# Patient Record
Sex: Male | Born: 1935 | Race: White | Hispanic: No | Marital: Single | Smoking: Former smoker
Health system: Southern US, Community
[De-identification: ages and names within clinical notes are randomized; demographics above are authoritative.]

## PROBLEM LIST (undated history)

## (undated) DIAGNOSIS — I10 Essential (primary) hypertension: Secondary | ICD-10-CM

## (undated) DIAGNOSIS — E7800 Pure hypercholesterolemia, unspecified: Secondary | ICD-10-CM

## (undated) HISTORY — PX: CATARACT EXTRACTION: SUR2

---

## 2017-03-31 ENCOUNTER — Encounter (HOSPITAL_COMMUNITY): Payer: Self-pay

## 2017-03-31 ENCOUNTER — Emergency Department (HOSPITAL_COMMUNITY)
Admission: EM | Admit: 2017-03-31 | Discharge: 2017-03-31 | Disposition: A | Discharge status: Home / Self Care | Payer: Medicare Other | Attending: Physician Assistant | Admitting: Physician Assistant

## 2017-03-31 ENCOUNTER — Emergency Department (HOSPITAL_COMMUNITY): Payer: Medicare Other

## 2017-03-31 DIAGNOSIS — S8992XA Unspecified injury of left lower leg, initial encounter: Secondary | ICD-10-CM | POA: Diagnosis present

## 2017-03-31 DIAGNOSIS — Y999 Unspecified external cause status: Secondary | ICD-10-CM | POA: Insufficient documentation

## 2017-03-31 DIAGNOSIS — Z7982 Long term (current) use of aspirin: Secondary | ICD-10-CM | POA: Diagnosis not present

## 2017-03-31 DIAGNOSIS — S40812A Abrasion of left upper arm, initial encounter: Secondary | ICD-10-CM | POA: Insufficient documentation

## 2017-03-31 DIAGNOSIS — W010XXA Fall on same level from slipping, tripping and stumbling without subsequent striking against object, initial encounter: Secondary | ICD-10-CM | POA: Diagnosis not present

## 2017-03-31 DIAGNOSIS — I10 Essential (primary) hypertension: Secondary | ICD-10-CM | POA: Diagnosis not present

## 2017-03-31 DIAGNOSIS — Y929 Unspecified place or not applicable: Secondary | ICD-10-CM | POA: Diagnosis not present

## 2017-03-31 DIAGNOSIS — Y939 Activity, unspecified: Secondary | ICD-10-CM | POA: Diagnosis not present

## 2017-03-31 DIAGNOSIS — W19XXXA Unspecified fall, initial encounter: Secondary | ICD-10-CM

## 2017-03-31 DIAGNOSIS — S81812A Laceration without foreign body, left lower leg, initial encounter: Secondary | ICD-10-CM | POA: Insufficient documentation

## 2017-03-31 HISTORY — DX: Essential (primary) hypertension: I10

## 2017-03-31 HISTORY — DX: Pure hypercholesterolemia, unspecified: E78.00

## 2017-03-31 LAB — BASIC METABOLIC PANEL
ANION GAP: 9 (ref 5–15)
BUN: 20 mg/dL (ref 6–20)
CO2: 26 mmol/L (ref 22–32)
Calcium: 9.8 mg/dL (ref 8.9–10.3)
Chloride: 107 mmol/L (ref 101–111)
Creatinine, Ser: 1.17 mg/dL (ref 0.61–1.24)
GFR calc Af Amer: >60 mL/min (ref >60)
GFR, EST NON AFRICAN AMERICAN: 57 mL/min — AB (ref >60)
GLUCOSE: 124 mg/dL — AB (ref 65–99)
POTASSIUM: 4.1 mmol/L (ref 3.5–5.1)
SODIUM: 142 mmol/L (ref 135–145)

## 2017-03-31 LAB — CBC WITH DIFFERENTIAL/PLATELET
Basophils Absolute: 0 10*3/uL (ref 0.0–0.1)
Basophils Relative: 0 %
Eosinophils Absolute: 0.1 10*3/uL (ref 0.0–0.7)
Eosinophils Relative: 1 %
HCT: 37.9 % — ABNORMAL LOW (ref 39.0–52.0)
HEMOGLOBIN: 12.5 g/dL — AB (ref 13.0–17.0)
LYMPHS ABS: 1.8 10*3/uL (ref 0.7–4.0)
Lymphocytes Relative: 18 %
MCH: 29.7 pg (ref 26.0–34.0)
MCHC: 33 g/dL (ref 30.0–36.0)
MCV: 90 fL (ref 78.0–100.0)
Monocytes Absolute: 0.7 10*3/uL (ref 0.1–1.0)
Monocytes Relative: 7 %
Neutro Abs: 7.2 10*3/uL (ref 1.7–7.7)
Neutrophils Relative %: 74 %
PLATELETS: 237 10*3/uL (ref 150–400)
RBC: 4.21 MIL/uL — AB (ref 4.22–5.81)
RDW: 14.7 % (ref 11.5–15.5)
WBC: 9.8 10*3/uL (ref 4.0–10.5)

## 2017-03-31 MED ORDER — CEPHALEXIN 500 MG PO CAPS: 500.0000 mg | ORAL_CAPSULE | Freq: Two times a day (BID) | ORAL | 0 refills | Status: AC

## 2017-03-31 MED ORDER — LIDOCAINE-EPINEPHRINE (PF) 2 %-1:200000 IJ SOLN
20.0000 mL | Freq: Once | INTRAMUSCULAR | Status: AC
  Administered 2017-03-31: 20 mL via INTRADERMAL
  Filled 2017-03-31: qty 20

## 2017-03-31 MED ORDER — TETANUS-DIPHTHERIA TOXOIDS TD 5-2 LFU IM INJ: 0.5000 mL | INJECTION | Freq: Once | INTRAMUSCULAR | Status: DC

## 2017-03-31 MED ORDER — TETANUS-DIPHTH-ACELL PERTUSSIS 5-2.5-18.5 LF-MCG/0.5 IM SUSP
0.5000 mL | Freq: Once | INTRAMUSCULAR | Status: AC
  Administered 2017-03-31: 0.5 mL via INTRAMUSCULAR
  Filled 2017-03-31: qty 0.5

## 2017-03-31 NOTE — ED Triage Notes (Signed)
[facility]- pt coming from home, he tripped over a pile of cinder blocks and has large laceration to the left leg. Pt had approximately 400-500 cc of blood loss. He also reports a small amount of left flank pain and some abrasions to the right arm. Vital signs stable with EMS.

## 2017-03-31 NOTE — ED Provider Notes (Signed)
[facility]Y DEPT Provider Note   CSN: [id number] Arrival date & time: 03/31/17  1703     History   Chief Complaint Chief Complaint  Patient presents with  . Leg Injury  . Fall    HPI Jonathan Bernard. is a 82 y.o. male.  The history is provided by the patient.  Leg Pain   This is a new problem. The current episode started less than 1 hour ago. The problem has not changed since onset.The pain is present in the left lower leg. The quality of the pain is described as aching. The pain is at a severity of 5/10. The pain is moderate. Pertinent negatives include no numbness, full range of motion and no tingling. The symptoms are aggravated by contact. He has tried nothing for the symptoms. There has been a history of trauma.    Past Medical History:  Diagnosis Date  . Hypercholesteremia   . Hypertension     There are no active problems to display for this patient.   History reviewed. No pertinent surgical history.     Home Medications    Prior to Admission medications   Medication Sig Start Date End Date Taking? Authorizing Provider  amLODipine (NORVASC) 10 MG tablet Take 10 mg by mouth daily.   Yes Historical Provider, MD  aspirin EC 325 MG tablet Take 325 mg by mouth every morning.   Yes Historical Provider, MD  CALCIUM-VITAMIN D PO Take 1 tablet by mouth daily.   Yes Historical Provider, MD  cilostazol (PLETAL) 100 MG tablet Take 100 mg by mouth 2 (two) times daily.   Yes Historical Provider, MD  hydrochlorothiazide (HYDRODIURIL) 25 MG tablet Take 25 mg by mouth daily.   Yes Historical Provider, MD  lisinopril (PRINIVIL,ZESTRIL) 40 MG tablet Take 40 mg by mouth daily.   Yes Historical Provider, MD  Multiple Vitamins-Minerals (ONE-A-DAY MENS 50+ ADVANTAGE) TABS Take 1 tablet by mouth daily with breakfast.   Yes Historical Provider, MD  naproxen (NAPROSYN) 375 MG tablet Take 375 mg by mouth 2 (two) times daily with a meal.   Yes Historical Provider, MD  rosuvastatin  (CRESTOR) 20 MG tablet Take 20 mg by mouth daily.   Yes Historical Provider, MD  cephALEXin (KEFLEX) 500 MG capsule Take 1 capsule (500 mg total) by mouth 2 (two) times daily. 03/31/17 04/10/17  Forest Becker, MD    Family History History reviewed. No pertinent family history.  Social History Social History  Substance Use Topics  . Smoking status: Never Smoker  . Smokeless tobacco: Never Used  . Alcohol use No     Allergies   Patient has no known allergies.   Review of Systems Review of Systems  Constitutional: Negative for chills and fever.  HENT: Negative for ear pain and sore throat.   Eyes: Negative for pain and visual disturbance.  Respiratory: Negative for cough and shortness of breath.   Cardiovascular: Positive for chest pain (left ribs). Negative for palpitations.  Gastrointestinal: Negative for abdominal pain and vomiting.  Genitourinary: Negative for dysuria and hematuria.  Musculoskeletal: Positive for gait problem. Negative for arthralgias, back pain and neck pain.  Skin: Positive for wound. Negative for color change and rash.  Neurological: Negative for tingling, seizures, syncope and numbness.  All other systems reviewed and are negative.   Physical Exam Updated Vital Signs BP (!) 157/74   Pulse 75   Temp 97.6 F (36.4 C) (Oral)   Resp 17   SpO2 96%   Physical Exam  Constitutional: He is oriented to person, place, and time. He appears well-developed and well-nourished.  HENT:  Head: Normocephalic and atraumatic.  Eyes: Conjunctivae are normal.  Neck: Neck supple.  Cardiovascular: Normal rate and regular rhythm.   No murmur heard. Symmetric DP pulses  Pulmonary/Chest: Effort normal and breath sounds normal. No respiratory distress.  Symmetric b/l breath sounds  Abdominal: Soft. There is no tenderness.  Musculoskeletal: He exhibits no edema or deformity.  TTP about lower left lateral ribs w/o underlying crepitus  Neurological: He is alert and  oriented to person, place, and time.  Full sensation distal to his injury  Skin: Skin is warm and dry.  Scattered superficial abrasions to the left UE and LE; large hemostatic laceration to the anterolateral left shin  Psychiatric: He has a normal mood and affect.  Nursing note and vitals reviewed.    ED Treatments / Results  Labs (all labs ordered are listed, but only abnormal results are displayed) Labs Reviewed  CBC WITH DIFFERENTIAL/PLATELET - Abnormal; Notable for the following:       Result Value   RBC 4.21 (*)    Hemoglobin 12.5 (*)    HCT 37.9 (*)    All other components within normal limits  BASIC METABOLIC PANEL - Abnormal; Notable for the following:    Glucose, Bld 124 (*)    GFR calc non Af Amer 57 (*)    All other components within normal limits    EKG  EKG Interpretation None       Radiology Dg Tibia/fibula Left  Result Date: 03/31/2017 CLINICAL DATA:  Tripped over center blocks and has laceration to the leg. EXAM: LEFT TIBIA AND FIBULA - 2 VIEW COMPARISON:  None. FINDINGS: No acute fracture or dislocation of the left tibia nor fibula is identified. Popliteal and tibial arteriosclerosis is noted. No radiopaque foreign body within the soft tissues. Osteoarthritis of the femorotibial compartment with medial joint space narrowing and minimal spurring. The ankle mortise appears intact. Small ossific density adjacent to the medial malleolus may reflect chronic remote injury. Mild diffuse soft tissue swelling is seen of the leg with slight indentation along the lateral aspect of the leg on the AP view at midshaft which may reflect a site of soft tissue injury. IMPRESSION: 1. Soft tissue defect along the lateral aspect of the left leg may reflect the site of soft tissue injury/ laceration. 2. Mild soft tissue swelling of the leg. 3. No acute osseous abnormality. 4. No radiopaque foreign body is seen. Electronically Signed   By: Tollie Eth M.D.   On: 03/31/2017 18:24   Dg  Chest Portable 1 View  Result Date: 03/31/2017 CLINICAL DATA:  [facility]- pt coming from home, he tripped over a pile of cinder blocks and has large laceration to the left leg. Pt had approximately 400-500 cc of blood loss. He also reports a small amount of left flank pain and some abrasions to .*comment was truncated* EXAM: PORTABLE CHEST 1 VIEW COMPARISON:  None. FINDINGS: Normal mediastinum and cardiac silhouette. Normal pulmonary vasculature. No evidence of effusion, infiltrate, or pneumothorax. No acute bony abnormality. IMPRESSION: No acute cardiopulmonary process. Electronically Signed   By: Genevive Bi M.D.   On: 03/31/2017 18:21    Procedures .Marland KitchenLaceration Repair Date/Time: 04/01/2017 1:07 AM Performed by: Forest Becker Authorized by: Bary Castilla LYN   Consent:    Consent obtained:  Verbal   Consent given by:  Patient   Risks discussed:  Infection, need for additional repair, pain, poor  cosmetic result and poor wound healing   Alternatives discussed:  Referral Anesthesia (see MAR for exact dosages):    Anesthesia method:  Local infiltration   Local anesthetic:  Lidocaine 2% WITH epi Laceration details:    Location:  Leg   Leg location:  L lower leg   Length (cm):  15 Repair type:    Repair type:  Simple Pre-procedure details:    Preparation:  Patient was prepped and draped in usual sterile fashion Exploration:    Hemostasis achieved with:  Direct pressure   Wound exploration: entire depth of wound probed and visualized     Wound extent: no underlying fracture noted     Contaminated: no   Treatment:    Area cleansed with:  Saline   Amount of cleaning:  Standard   Irrigation solution:  Sterile water   Irrigation method:  Syringe Skin repair:    Repair method:  Sutures   Suture size:  3-0   Suture material:  Prolene   Suture technique: Simple interrupted and horizontal mattress.   Number of sutures:  13 Approximation:    Approximation:  Close Post-procedure  details:    Dressing: Xeroform, antibiotic ointment, sterile gauze, Coban.   Patient tolerance of procedure:  Tolerated well, no immediate complications   (including critical care time)  Medications Ordered in ED Medications  Tdap (BOOSTRIX) injection 0.5 mL (0.5 mLs Intramuscular Given 03/31/17 1815)  lidocaine-EPINEPHrine (XYLOCAINE W/EPI) 2 %-1:200000 (PF) injection 20 mL (20 mLs Intradermal Given 03/31/17 2056)     Initial Impression / Assessment and Plan / ED Course  I have reviewed the triage vital signs and the nursing notes.  Pertinent labs & imaging results that were available during my care of the patient were reviewed by me and considered in my medical decision making (see chart for details).    Pt presents with a large laceration to the LLE. Was standing on a stump trying to reach something when it toppled over, causing him to fall on a pile of bricks; he denies head/neck trauma or LOC. Family called EMS and they applied a dressing to his laceration to stop the bleeding before transporting him here; they estimate he lost ~500cc of blood before they could stop the bleeding. He only complains of LLE and left rib pain.  VS & exam as above. XR w/o fractures or FB. Labs remarkable for Hgb 12.5 (no comparison available). Laceration repaired at the bedside.  Explained all results to the Pt. Will discharge the Pt home with prescription for Keflex. Recommending follow-up with PCP in 1wk for wound check. ED return precautions provided. Pt acknowledged understanding of, and concurrence with the plan. All questions answered to his satisfaction. In stable condition at the time of discharge.   Final Clinical Impressions(s) / ED Diagnoses   Final diagnoses:  Fall, initial encounter  Laceration of left lower extremity, initial encounter    New Prescriptions Discharge Medication List as of 03/31/2017 11:14 PM    START taking these medications   Details  cephALEXin (KEFLEX) 500 MG  capsule Take 1 capsule (500 mg total) by mouth 2 (two) times daily., Starting Fri 03/31/2017, Until Mon 04/10/2017, Print         Forest Becker, MD 04/01/17 0111    Courteney Randall An, MD 04/02/17 1124

## 2018-05-14 IMAGING — CR DG CHEST 1V PORT
1 series · 1 of 1 positions shown · non-contrast
Comparison: None.

CLINICAL DATA: [REDACTED]- pt coming from home, he tripped over a pile
of cinder blocks and has large laceration to the left leg. Pt had
approximately 400-500 cc of blood loss. He also reports a small
amount of left flank pain and some abrasions to ...*comment was
truncated*

EXAM:
PORTABLE CHEST 1 VIEW

[AP]
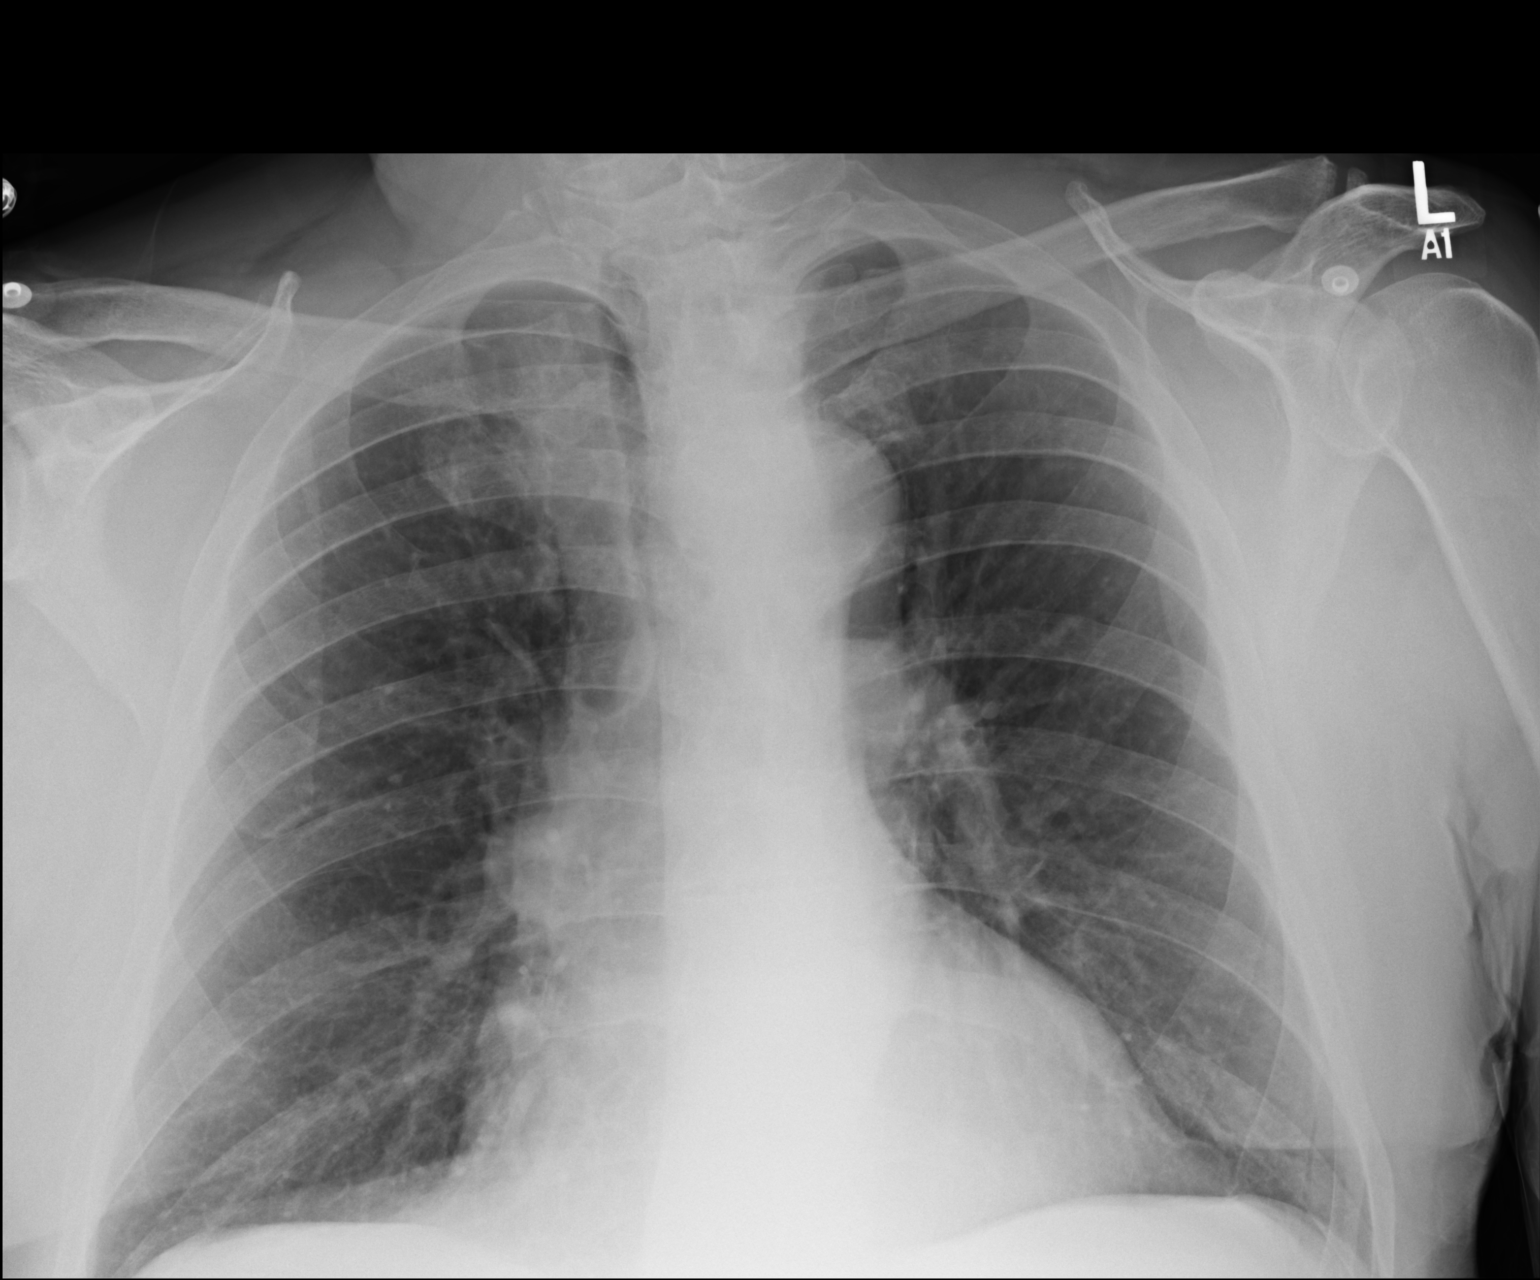

[1 of 1 positions shown; findings below may reference images not displayed]

FINDINGS: Normal mediastinum and cardiac silhouette. Normal pulmonary
vasculature. No evidence of effusion, infiltrate, or pneumothorax.
No acute bony abnormality.
IMPRESSION: No acute cardiopulmonary process.

## 2018-05-14 IMAGING — CR DG TIBIA/FIBULA 2V*L*
4 series · 4 of 4 positions shown · non-contrast
Comparison: None.

CLINICAL DATA: Tripped over center blocks and has laceration to the
leg.

EXAM:
LEFT TIBIA AND FIBULA - 2 VIEW

[AP (1 of 2)]
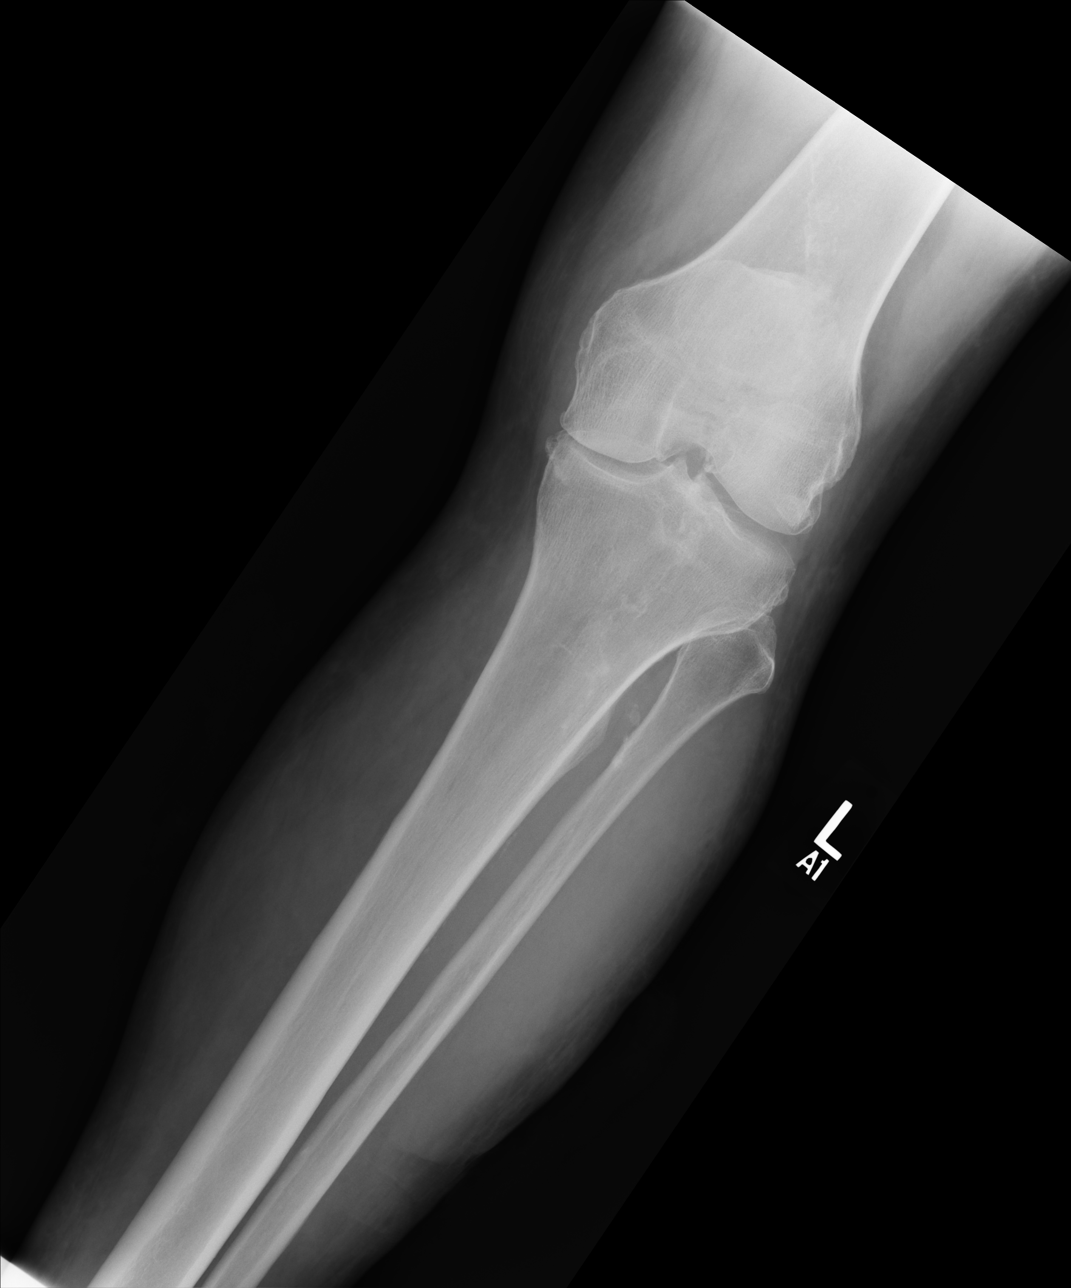

[AP (2 of 2)]
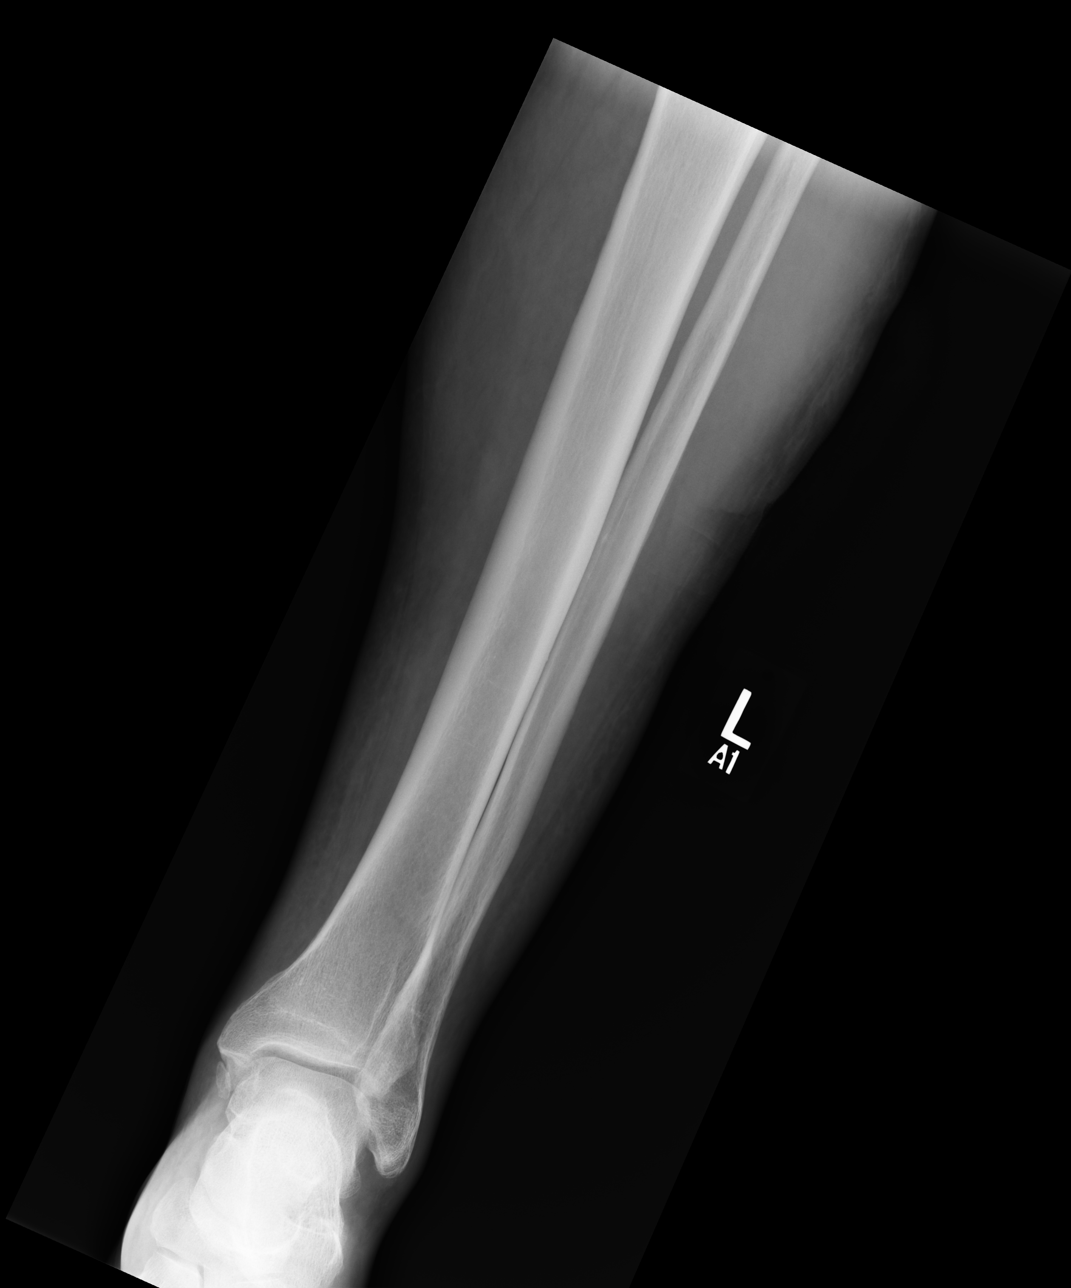

[lateral (1 of 2)]
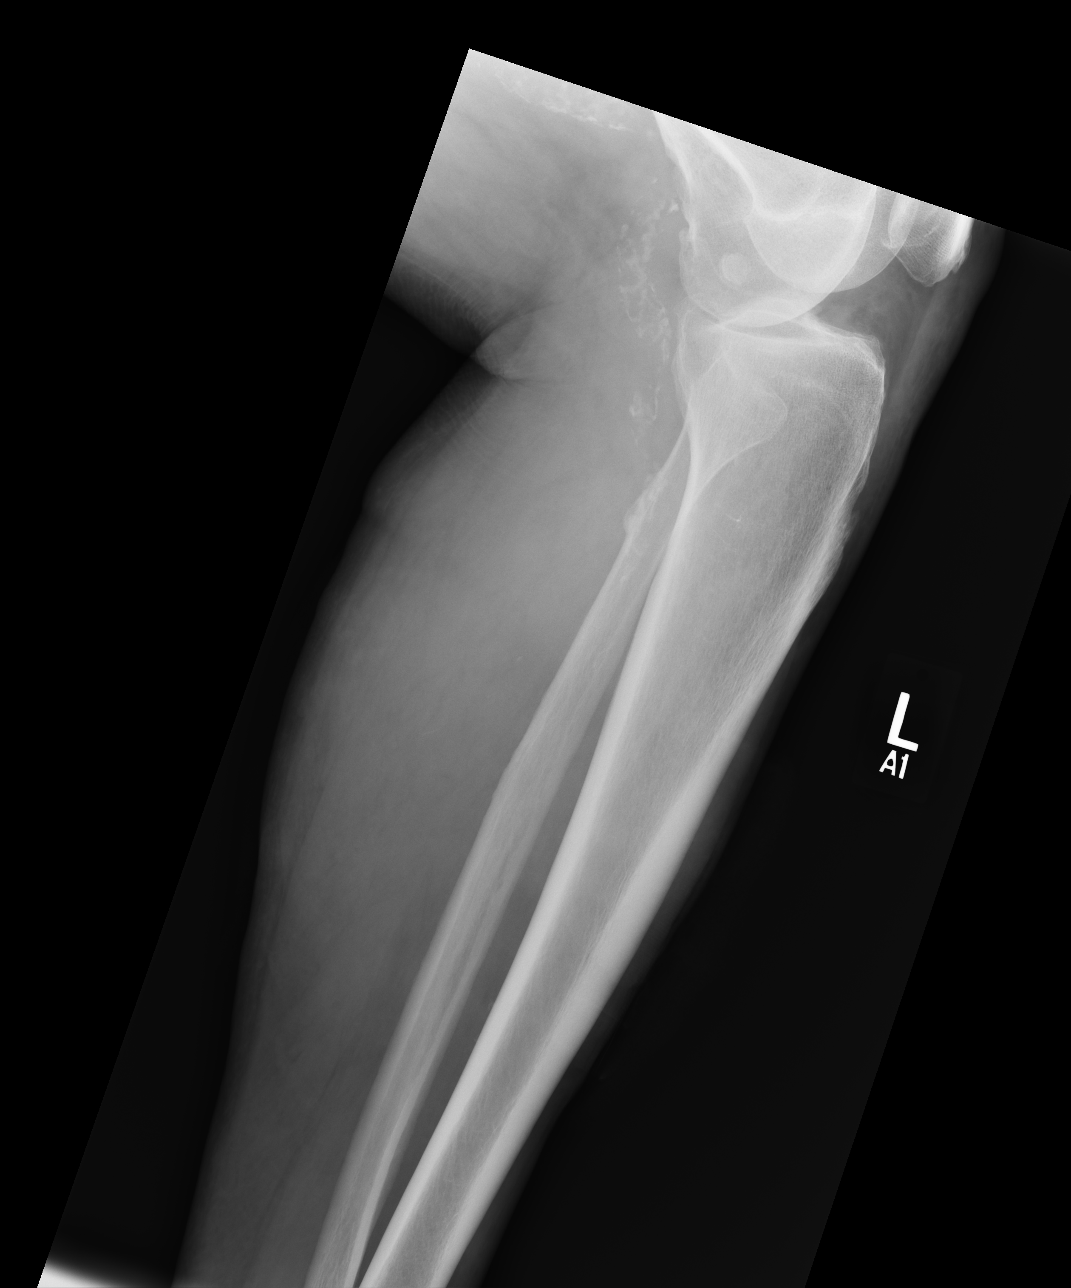

[lateral (2 of 2)]
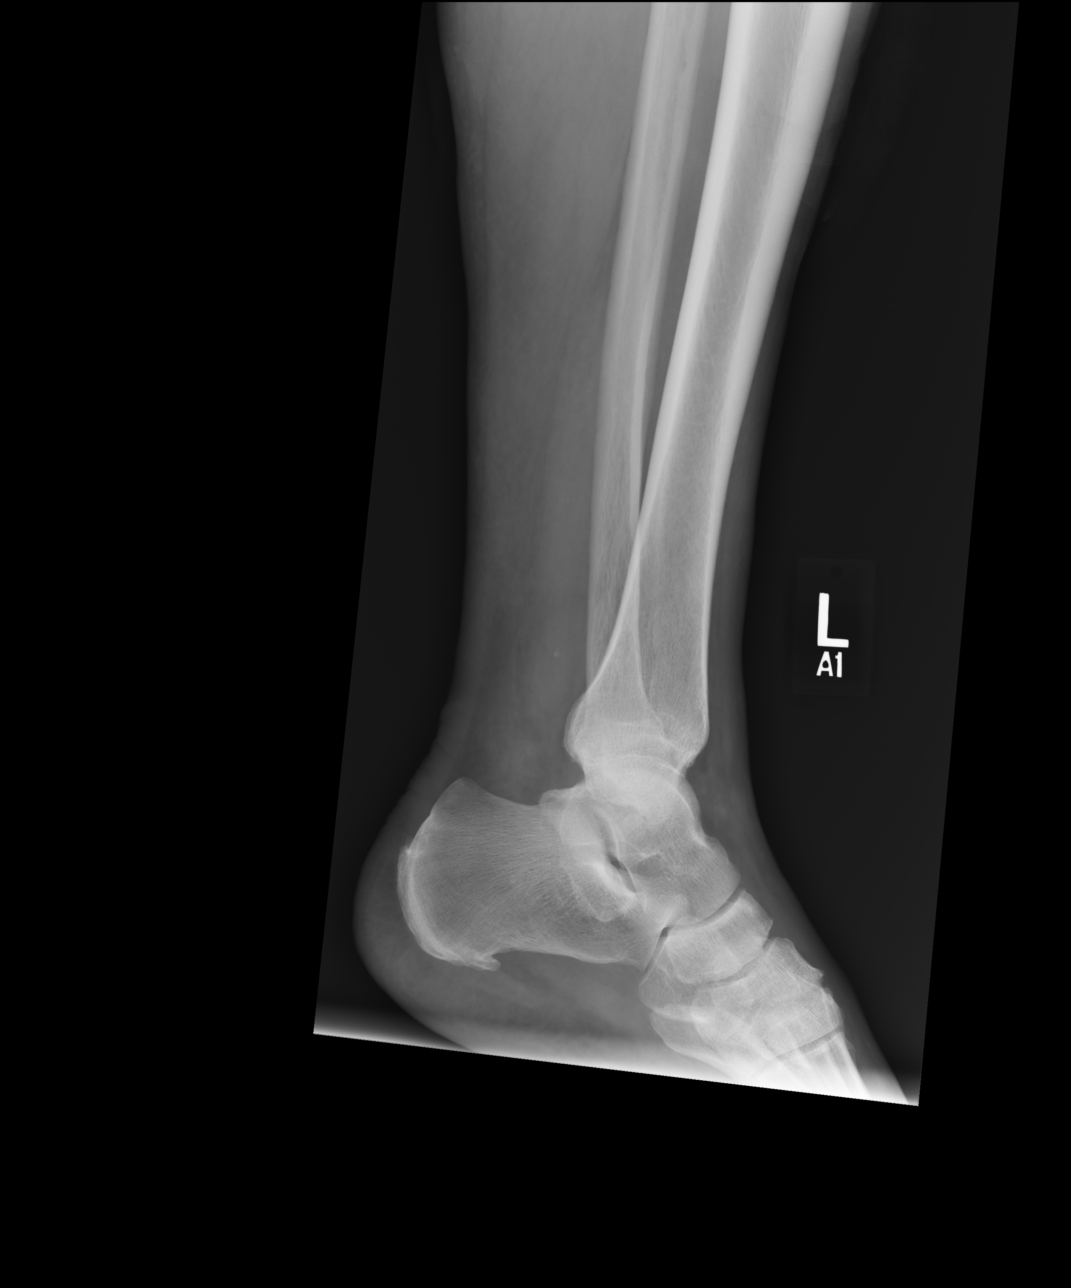

[4 of 4 positions shown; findings below may reference images not displayed]

FINDINGS: No acute fracture or dislocation of the left tibia nor fibula is
identified. Popliteal and tibial arteriosclerosis is noted. No
radiopaque foreign body within the soft tissues. Osteoarthritis of
the femorotibial compartment with medial joint space narrowing and
minimal spurring. The ankle mortise appears intact. Small ossific
density adjacent to the medial malleolus may reflect chronic remote
injury. Mild diffuse soft tissue swelling is seen of the leg with
slight indentation along the lateral aspect of the leg on the AP
view at midshaft which may reflect a site of soft tissue injury.
IMPRESSION: 1. Soft tissue defect along the lateral aspect of the left leg may
reflect the site of soft tissue injury/ laceration.
2. Mild soft tissue swelling of the leg.
3. No acute osseous abnormality.
4. No radiopaque foreign body is seen.

## 2023-06-28 ENCOUNTER — Emergency Department (HOSPITAL_COMMUNITY)
Admission: EM | Admit: 2023-06-28 | Discharge: 2023-06-28 | Disposition: A | Discharge status: Home / Self Care | Payer: No Typology Code available for payment source | Attending: Emergency Medicine | Admitting: Emergency Medicine

## 2023-06-28 ENCOUNTER — Emergency Department (HOSPITAL_BASED_OUTPATIENT_CLINIC_OR_DEPARTMENT_OTHER): Payer: No Typology Code available for payment source

## 2023-06-28 ENCOUNTER — Encounter (HOSPITAL_COMMUNITY): Payer: Self-pay

## 2023-06-28 ENCOUNTER — Emergency Department (HOSPITAL_COMMUNITY): Payer: No Typology Code available for payment source

## 2023-06-28 ENCOUNTER — Other Ambulatory Visit: Payer: Self-pay

## 2023-06-28 DIAGNOSIS — M79604 Pain in right leg: Secondary | ICD-10-CM | POA: Diagnosis not present

## 2023-06-28 DIAGNOSIS — M7989 Other specified soft tissue disorders: Secondary | ICD-10-CM | POA: Diagnosis not present

## 2023-06-28 DIAGNOSIS — M79602 Pain in left arm: Secondary | ICD-10-CM | POA: Diagnosis not present

## 2023-06-28 DIAGNOSIS — Z7982 Long term (current) use of aspirin: Secondary | ICD-10-CM | POA: Insufficient documentation

## 2023-06-28 LAB — BASIC METABOLIC PANEL
Anion gap: 8 (ref 5–15)
BUN: 19 mg/dL (ref 8–23)
CO2: 25 mmol/L (ref 22–32)
Calcium: 8.8 mg/dL — ABNORMAL LOW (ref 8.9–10.3)
Chloride: 104 mmol/L (ref 98–111)
Creatinine, Ser: 0.94 mg/dL (ref 0.61–1.24)
GFR, Estimated: >60 mL/min (ref >60)
Glucose, Bld: 126 mg/dL — ABNORMAL HIGH (ref 70–99)
Potassium: 3.7 mmol/L (ref 3.5–5.1)
Sodium: 137 mmol/L (ref 135–145)

## 2023-06-28 LAB — CBC
HCT: 39.4 % (ref 39.0–52.0)
Hemoglobin: 12.4 g/dL — ABNORMAL LOW (ref 13.0–17.0)
MCH: 28.8 pg (ref 26.0–34.0)
MCHC: 31.5 g/dL (ref 30.0–36.0)
MCV: 91.4 fL (ref 80.0–100.0)
Platelets: 245 10*3/uL (ref 150–400)
RBC: 4.31 MIL/uL (ref 4.22–5.81)
RDW: 17.1 % — ABNORMAL HIGH (ref 11.5–15.5)
WBC: 7.9 10*3/uL (ref 4.0–10.5)
nRBC: 0 % (ref 0.0–0.2)

## 2023-06-28 LAB — TROPONIN I (HIGH SENSITIVITY): Troponin I (High Sensitivity): 8 ng/L (ref <18)

## 2023-06-28 NOTE — Progress Notes (Signed)
   06/28/23 0953  Spiritual Encounters  Type of Visit Initial  Care provided to: Patient  Reason for visit Routine spiritual support  OnCall Visit No  Spiritual Framework  Presenting Themes Meaning/purpose/sources of inspiration;Values and beliefs  Community/Connection Family  Patient Stress Factors Health changes  Family Stress Factors None identified  Interventions  Spiritual Care Interventions Made Established relationship of care and support;Compassionate presence;Reflective listening;Normalization of emotions  Intervention Outcomes  Outcomes Connection to spiritual care;Awareness around self/spiritual resourses;Connection to values and goals of care;Awareness of support  Spiritual Care Plan  Spiritual Care Issues Still Outstanding No further spiritual care needs at this time (see row info)    The patient was admitted to the ED this morning. His doctor suspected a blood clot and wanted to make sure to see. He mentioned that he completed two different majors in [facility] [employer]. He had lived there seven years. He seems healthy looking man. His grand kids lives in [place], [place], but never came to [facility] to visit him. Then, a nurse came and he went to [facility]. He said that he is very appreciated to talk with a chaplain.   M.Kubra Rejina Odle, MA Chaplain Intern [phone]

## 2023-06-28 NOTE — Progress Notes (Signed)
Right leg venous duplex  has been completed. Refer to [facility] under chart review to view preliminary results.   06/28/2023  10:30 AM Avner Stroder, Gerarda Gunther

## 2023-06-28 NOTE — ED Notes (Signed)
Patient transported to [facility] 

## 2023-06-28 NOTE — Discharge Instructions (Signed)
As discussed, your evaluation today has been largely reassuring.  But, it is important that you monitor your condition carefully, and do not hesitate to return to the ED if you develop new, or concerning changes in your condition. ? ?Otherwise, please follow-up with your physician for appropriate ongoing care. ? ?

## 2023-06-28 NOTE — ED Provider Notes (Signed)
[place] EMERGENCY DEPARTMENT AT [facility] Provider Note   CSN: [id number] Arrival date & time: 06/28/23  4098     History  Chief Complaint  Patient presents with   Abnormal Lab    Jonathan Bernard. is a 88 y.o. male.  HPI Patient presents with concern for ongoing swelling in his left arm, right leg and notice of abnormal lab results. He fell about 3 weeks ago, and since that time has had evaluation of his right leg including MRI, x-ray, and he notes that he is generally getting better though he has persistent swelling about the anterior aspect of the right knee. He has been ambulatory since the fall. Patient had an awkward event 2 days ago, with prolonged downtime resting on his left arm and since that time has had pain in the left arm.  Since that time that pain is also improved somewhat, and currently has no pain though there is persistent swelling and ecchymosis on the left arm.  No chest pain, abdominal pain, dyspnea, syncope. He has received care thus far at the [place].  Reportedly the patient had labs sent earlier this week, and today was notified of abnormal results prompting ED evaluation.  Reportedly that result was a positive D-dimer.    Home Medications Prior to Admission medications   Medication Sig Start Date End Date Taking? Authorizing Provider  amLODipine (NORVASC) 10 MG tablet Take 10 mg by mouth daily.    [provider]  aspirin EC 325 MG tablet Take 325 mg by mouth every morning.    [provider]  CALCIUM-VITAMIN D PO Take 1 tablet by mouth daily.    [provider]  cilostazol (PLETAL) 100 MG tablet Take 100 mg by mouth 2 (two) times daily.    [provider]  hydrochlorothiazide (HYDRODIURIL) 25 MG tablet Take 25 mg by mouth daily.    [provider]  lisinopril (PRINIVIL,ZESTRIL) 40 MG tablet Take 40 mg by mouth daily.    [provider]  Multiple Vitamins-Minerals (ONE-A-DAY MENS 50+  ADVANTAGE) TABS Take 1 tablet by mouth daily with breakfast.    [provider]  naproxen (NAPROSYN) 375 MG tablet Take 375 mg by mouth 2 (two) times daily with a meal.    [provider]  rosuvastatin (CRESTOR) 20 MG tablet Take 20 mg by mouth daily.    [provider]      Allergies    Patient has no known allergies.    Review of Systems   Review of Systems  All other systems reviewed and are negative.   Physical Exam Updated Vital Signs BP 138/86   Pulse 87   Temp 98.4 F (36.9 C) (Oral)   Resp (!) 22   Ht 6' (1.829 m)   Wt 95.3 kg   SpO2 98%   BMI 28.48 kg/m  Physical Exam Vitals and nursing note reviewed.  Constitutional:      General: He is not in acute distress.    Appearance: He is well-developed.  HENT:     Head: Normocephalic and atraumatic.  Eyes:     Conjunctiva/sclera: Conjunctivae normal.  Cardiovascular:     Rate and Rhythm: Normal rate and regular rhythm.  Pulmonary:     Effort: Pulmonary effort is normal. No respiratory distress.     Breath sounds: No stridor.  Abdominal:     General: There is no distension.  Musculoskeletal:       Arms:  Legs:  Skin:    General: Skin is warm and dry.  Neurological:     Mental Status: He is alert and oriented to person, place, and time.     ED Results / Procedures / Treatments   Labs (all labs ordered are listed, but only abnormal results are displayed) Labs Reviewed  BASIC METABOLIC PANEL - Abnormal; Notable for the following components:      Result Value   Glucose, Bld 126 (*)    Calcium 8.8 (*)    All other components within normal limits  CBC - Abnormal; Notable for the following components:   Hemoglobin 12.4 (*)    RDW 17.1 (*)    All other components within normal limits  TROPONIN I (HIGH SENSITIVITY)  TROPONIN I (HIGH SENSITIVITY)    EKG EKG Interpretation Date/Time:  Wednesday June 28 2023 08:27:52 EDT Ventricular Rate:  93 PR Interval:  141 QRS  Duration:  111 QT Interval:  448 QTC Calculation: 558 R Axis:   -67  Text Interpretation: Sinus or ectopic atrial rhythm Left anterior fascicular block Consider anterior infarct Prolonged QT interval Confirmed by Gerhard Munch [phone]) on 06/28/2023 8:29:41 AM  Radiology UE VENOUS DUPLEX (7am - 7pm)  Result Date: 06/28/2023 UPPER VENOUS STUDY  Patient Name:  Jonathan Diekman.  Date of Exam:   06/28/2023 Medical Rec #: [id number]             Accession #:    [id number] Date of Birth: 04-29-1935             Patient Gender: M Patient Age:   37 years Exam Location:  [facility] Procedure:      VAS [place] UPPER EXTREMITY VENOUS DUPLEX Referring Phys: Molly Maduro Fatima Fedie --------------------------------------------------------------------------------  Indications: Pain, Swelling, and left arm bruised from lowe arm up to elbow. Comparison Study: No priors. Performing Technologist: Marilynne Halsted RDMS, RVT  Examination Guidelines: A complete evaluation includes B-mode imaging, spectral Doppler, color Doppler, and power Doppler as needed of all accessible portions of each vessel. Bilateral testing is considered an integral part of a complete examination. Limited examinations for reoccurring indications may be performed as noted.  Right Findings: +----------+------------+---------+-----------+----------+---------------------+ RIGHT     CompressiblePhasicitySpontaneousProperties       Summary        +----------+------------+---------+-----------+----------+---------------------+ Subclavian               Yes       Yes                 Appears patent.                                                        Technically difficult                                                      to visualize in its  entirty.        +----------+------------+---------+-----------+----------+---------------------+  Left Findings:  +----------+------------+---------+-----------+----------+-------+ LEFT      CompressiblePhasicitySpontaneousPropertiesSummary +----------+------------+---------+-----------+----------+-------+ IJV           Full       Yes       Yes                      +----------+------------+---------+-----------+----------+-------+ Subclavian               Yes       Yes                      +----------+------------+---------+-----------+----------+-------+ Axillary      Full       Yes       Yes                      +----------+------------+---------+-----------+----------+-------+ Brachial      Full                                          +----------+------------+---------+-----------+----------+-------+ Radial        Full                                          +----------+------------+---------+-----------+----------+-------+ Ulnar         Full                                          +----------+------------+---------+-----------+----------+-------+ Cephalic      Full                                          +----------+------------+---------+-----------+----------+-------+ Basilic       Full                                          +----------+------------+---------+-----------+----------+-------+  Summary:  Left: No evidence of deep vein thrombosis in the upper extremity. No evidence of superficial vein thrombosis in the upper extremity.  *See table(s) above for measurements and observations.    Preliminary    VAS [place] LOWER EXTREMITY VENOUS (DVT) (7a-7p)  Result Date: 06/28/2023  Lower Venous DVT Study Patient Name:  Jonathan Bracewell.  Date of Exam:   06/28/2023 Medical Rec #: [id number]             Accession #:    [id number] Date of Birth: [birth year]             Patient Gender: M Patient Age:   87 years Exam Location:  [facility] Procedure:      VAS [place] LOWER EXTREMITY VENOUS (DVT) Referring Phys: Molly Maduro Tijah Hane  --------------------------------------------------------------------------------  Indications: Pain, Swelling, and History of injury to right knee 18 days ago. Elevated d-dimer 2.95 at the [place].  Comparison Study: No priors. Performing Technologist: Marilynne Halsted RDMS, RVT  Examination Guidelines: A complete evaluation includes B-mode imaging, spectral Doppler, color Doppler, and power Doppler as needed of all  accessible portions of each vessel. Bilateral testing is considered an integral part of a complete examination. Limited examinations for reoccurring indications may be performed as noted. The reflux portion of the exam is performed with the patient in reverse Trendelenburg.  +---------+---------------+---------+-----------+----------+--------------+ RIGHT    CompressibilityPhasicitySpontaneityPropertiesThrombus Aging +---------+---------------+---------+-----------+----------+--------------+ CFV      Full           Yes      Yes                                 +---------+---------------+---------+-----------+----------+--------------+ SFJ      Full                                                        +---------+---------------+---------+-----------+----------+--------------+ FV Prox  Full                                                        +---------+---------------+---------+-----------+----------+--------------+ FV Mid   Full           Yes      Yes                                 +---------+---------------+---------+-----------+----------+--------------+ FV DistalFull                                                        +---------+---------------+---------+-----------+----------+--------------+ PFV      Full                                                        +---------+---------------+---------+-----------+----------+--------------+ POP      Full           Yes      Yes                                  +---------+---------------+---------+-----------+----------+--------------+ PTV      Full                                                        +---------+---------------+---------+-----------+----------+--------------+ PERO     Full                                                        +---------+---------------+---------+-----------+----------+--------------+   +----+---------------+---------+-----------+----------+--------------+ LEFTCompressibilityPhasicitySpontaneityPropertiesThrombus Aging +----+---------------+---------+-----------+----------+--------------+ CFV Full  Yes      Yes                                 +----+---------------+---------+-----------+----------+--------------+ SFJ Full                                                        +----+---------------+---------+-----------+----------+--------------+    Summary: RIGHT: - There is no evidence of deep vein thrombosis in the lower extremity.  - No cystic structure found in the popliteal fossa. - Right knee hematoma from injury noted.  LEFT: - No evidence of common femoral vein obstruction.  *See table(s) above for measurements and observations.    Preliminary    DG Chest 2 View  Result Date: 06/28/2023 CLINICAL DATA:  Elevated D-dimer EXAM: CHEST - 2 VIEW COMPARISON:  Chest radiograph dated 03/31/2017 FINDINGS: Normal lung volumes. No focal consolidations. No pleural effusion or pneumothorax. The heart size and mediastinal contours are within normal limits. No acute osseous abnormality. IMPRESSION: No active cardiopulmonary disease. Electronically Signed   By: Agustin Cree M.D.   On: 06/28/2023 09:01    Procedures Procedures    Medications Ordered in ED Medications - No data to display  ED Course/ Medical Decision Making/ A&P                             Medical Decision Making Elderly male with multiple medical issues, presents after fall few weeks ago, now with left arm injury as well.   Concern is for persistent swelling, pain, though both of these have improved somewhat today.  Patient has no chest pain, dyspnea, but [facility], concern for thromboembolic processes given the inflammatory condition. Less likely PE, though this is a possibility. However, patient is not having chest pain, dyspnea, nor tachycardia. Patient's labs ultrasound x-ray ordered after eval. Cardiac 85 sinus normal Pulse ox 96% room air normal   Amount and/or Complexity of Data Reviewed External Data Reviewed: notes. Labs: ordered. Decision-making details documented in ED Course. Radiology: ordered and independent interpretation performed. Decision-making details documented in ED Course. ECG/medicine tests: ordered and independent interpretation performed. Decision-making details documented in ED Course.  Risk Prescription drug management. Decision regarding hospitalization. Diagnosis or treatment significantly limited by social determinants of health.   On repeat exam the patient is in no distress.  Findings discussed, ultrasound, x-ray, labs all reassuring.  No evidence for pneumonia, bacteremia, sepsis, thromboembolic processes.  Some suspicion for persistent inflammatory condition given his fall, minor trauma. Patient comfortable with discharge with outpatient follow-up.         Final Clinical Impression(s) / ED Diagnoses Final diagnoses:  Pain of left upper extremity  Right leg pain     Gerhard Munch, MD 06/28/23 1133

## 2023-06-28 NOTE — ED Triage Notes (Addendum)
Pt was seen at [facility] UC and had elevated Ddimer 2.95. Pt sent to r/o blood clot left arm and right leg. Pt states he was working on toilet and over used left arm and it became bruised from lower arm up to elbow. Pt has ecchymotic area on posterior of left hand. Tp has 2+ left raidal, cap refill less than 3 sec, warm to touch. Pt has 3+ swelling of right leg and 2+ swelling of left leg. Pt has 2+ swelling of anterior of right knee and it's ecchymotic. Pt has 1+ right pedal pulse, warm to touch.

## 2024-03-25 ENCOUNTER — Other Ambulatory Visit (HOSPITAL_COMMUNITY): Payer: Self-pay | Admitting: Urology

## 2024-03-25 DIAGNOSIS — N281 Cyst of kidney, acquired: Secondary | ICD-10-CM

## 2024-04-01 ENCOUNTER — Ambulatory Visit (HOSPITAL_COMMUNITY)

## 2024-04-01 ENCOUNTER — Encounter (HOSPITAL_COMMUNITY): Payer: Self-pay

## 2024-05-15 ENCOUNTER — Encounter (HOSPITAL_COMMUNITY): Payer: Self-pay

## 2024-05-15 ENCOUNTER — Ambulatory Visit (HOSPITAL_COMMUNITY): Admission: EM | Admit: 2024-05-15 | Discharge: 2024-05-15 | Disposition: A | Discharge status: Home / Self Care

## 2024-05-15 DIAGNOSIS — S51812A Laceration without foreign body of left forearm, initial encounter: Secondary | ICD-10-CM

## 2024-05-15 DIAGNOSIS — Z23 Encounter for immunization: Secondary | ICD-10-CM | POA: Diagnosis not present

## 2024-05-15 MED ORDER — TETANUS-DIPHTH-ACELL PERTUSSIS 5-2.5-18.5 LF-MCG/0.5 IM SUSY
PREFILLED_SYRINGE | INTRAMUSCULAR | Status: AC
  Filled 2024-05-15: qty 0.5

## 2024-05-15 MED ORDER — TETANUS-DIPHTH-ACELL PERTUSSIS 5-2.5-18.5 LF-MCG/0.5 IM SUSY
0.5000 mL | PREFILLED_SYRINGE | Freq: Once | INTRAMUSCULAR | Status: AC
  Administered 2024-05-15: 0.5 mL via INTRAMUSCULAR

## 2024-05-15 MED ORDER — MUPIROCIN CALCIUM 2 % EX CREA: 1.0000 | TOPICAL_CREAM | Freq: Two times a day (BID) | CUTANEOUS | 0 refills | Status: AC

## 2024-05-15 NOTE — ED Provider Notes (Signed)
 [facility]    CSN: [id number] Arrival date & time: 05/15/24  0801      History   Chief Complaint Chief Complaint  Patient presents with   Laceration    HPI Jonathan Bernard. is a 89 y.o. male.   Patient presents with laceration to left forearm that occurred the night of 5/26.  Patient states that he hit his arm on the corner of the microwave door and cut his arm.  Patient reports he has been able to control the bleeding, but upon taking a shower this morning the wound reopened and was bleeding again.  Patient denies any increased pain, swelling, spreading of redness, purulent drainage from the area, or any fever.  Patient reports last Tdap was in 2018.  Patient denies taking any blood thinners, but does report taking a baby aspirin daily.  The history is provided by the patient and medical records.  Laceration   Past Medical History:  Diagnosis Date   Hypercholesteremia    Hypertension     There are no active problems to display for this patient.   Past Surgical History:  Procedure Laterality Date   CATARACT EXTRACTION Bilateral        Home Medications    Prior to Admission medications   Medication Sig Start Date End Date Taking? Authorizing Provider  aspirin EC 81 MG tablet Take 81 mg by mouth daily. Swallow whole.   Yes [provider]  furosemide (LASIX) 20 MG tablet Take 20 mg by mouth daily. 03/08/24  Yes [provider]  gabapentin (NEURONTIN) 300 MG capsule Take 1 capsule by mouth 3 (three) times daily. 01/30/24  Yes [provider]  lidocaine  (LIDODERM ) 5 % Place 1 patch onto the skin daily. 07/27/23  Yes [provider]  mupirocin  cream (BACTROBAN ) 2 % Apply 1 Application topically 2 (two) times daily. 05/15/24  Yes Rosevelt Constable, Ulysess Witz A, NP  potassium chloride (KLOR-CON) 10 MEQ tablet Take 10 mEq by mouth once. 03/08/24  Yes [provider]  amLODipine (NORVASC) 10 MG tablet Take 10 mg by mouth daily.     [provider]  CALCIUM -VITAMIN D PO Take 1 tablet by mouth daily.    [provider]  cilostazol (PLETAL) 100 MG tablet Take 100 mg by mouth 2 (two) times daily.    [provider]  hydrochlorothiazide (HYDRODIURIL) 25 MG tablet Take 25 mg by mouth daily.    [provider]  lisinopril (PRINIVIL,ZESTRIL) 40 MG tablet Take 40 mg by mouth daily.    [provider]  Multiple Vitamins-Minerals (ONE-A-DAY MENS 50+ ADVANTAGE) TABS Take 1 tablet by mouth daily with breakfast.    [provider]  rosuvastatin (CRESTOR) 20 MG tablet Take 20 mg by mouth daily.    [provider]    Family History History reviewed. No pertinent family history.  Social History Social History   Tobacco Use   Smoking status: Former    Types: Cigarettes   Smokeless tobacco: Never  Substance Use Topics   Alcohol use: No   Drug use: Never     Allergies   Diclofenac   Review of Systems Review of Systems  Per HPI  Physical Exam Triage Vital Signs ED Triage Vitals  Encounter Vitals Group     BP 05/15/24 0823 110/70     Systolic BP Percentile --      Diastolic BP Percentile --      Pulse Rate 05/15/24 0823 82     Resp  05/15/24 0823 16     Temp 05/15/24 0823 98.2 F (36.8 C)     Temp Source 05/15/24 0823 Oral     SpO2 05/15/24 0823 92 %     Weight --      Height --      Head Circumference --      Peak Flow --      Pain Score 05/15/24 0828 0     Pain Loc --      Pain Education --      Exclude from Growth Chart --    No data found.  Updated Vital Signs BP 110/70 (BP Location: Right Arm)   Pulse 82   Temp 98.2 F (36.8 C) (Oral)   Resp 16   SpO2 92%   Visual Acuity Right Eye Distance:   Left Eye Distance:   Bilateral Distance:    Right Eye Near:   Left Eye Near:    Bilateral Near:     Physical Exam Vitals and nursing note reviewed.  Constitutional:      General: He is awake. He is not in acute distress.     Appearance: Normal appearance. He is well-developed and well-groomed. He is not ill-appearing.  Skin:    General: Skin is warm and dry.     Findings: Laceration and wound present.     Comments: Skin tear wound noted to the posterior aspect of the left forearm approximately 4 cm in length.  Bleeding is controlled.  Pictured below.    Neurological:     Mental Status: He is alert.  Psychiatric:        Behavior: Behavior is cooperative.      UC Treatments / Results  Labs (all labs ordered are listed, but only abnormal results are displayed) Labs Reviewed - No data to display  EKG   Radiology No results found.  Procedures Procedures (including critical care time)  Medications Ordered in UC Medications  Tdap (BOOSTRIX ) injection 0.5 mL (has no administration in time range)    Initial Impression / Assessment and Plan / UC Course  I have reviewed the triage vital signs and the nursing notes.  Pertinent labs & imaging results that were available during my care of the patient were reviewed by me and considered in my medical decision making (see chart for details).     Patient is well-appearing.  Vitals are stable.  Skin tear wound noted to the posterior aspect of the left forearm approximately 4 cm in length.  Bleeding is controlled.  Updated Tdap.  Provided basic wound care in clinic with bacitracin and wound dressing.  Prescribed mupirocin for infection prevention.  Discussed proper wound care at home.  Discussed follow-up and return precautions. Final Clinical Impressions(s) / UC Diagnoses   Final diagnoses:  Laceration of left forearm, initial encounter   Discharge Instructions      Apply mupirocin cream twice daily as you are doing dressing changes for infection prevention. Keep the area clean dry and covered. Avoid picking at the area or reirritating it to avoid reopening the wound. Return here if you notice any swelling, spreading of redness, or puslike drainage from  the area. [facility].  ED Prescriptions     Medication Sig Dispense Auth. Provider   mupirocin cream (BACTROBAN) 2 % Apply 1 Application topically 2 (two) times daily. 15 g Levora Reas A, NP      PDMP not reviewed this encounter.   Levora Reas A, NP 05/15/24 [phone]

## 2024-05-15 NOTE — Discharge Instructions (Signed)
 Apply mupirocin cream twice daily as you are doing dressing changes for infection prevention. Keep the area clean dry and covered. Avoid picking at the area or reirritating it to avoid reopening the wound. Return here if you notice any swelling, spreading of redness, or puslike drainage from the area. [facility]

## 2024-05-15 NOTE — ED Triage Notes (Signed)
 Patient here today with c/o laceration on left forearm Monday night. Patient states that he hit it on the corner of a microwave door that was open. Last Tdap 2018.

## 2024-05-22 ENCOUNTER — Telehealth (HOSPITAL_COMMUNITY): Payer: Self-pay

## 2024-05-22 MED ORDER — MUPIROCIN 2 % EX OINT
1.0000 | TOPICAL_OINTMENT | Freq: Two times a day (BID) | CUTANEOUS | 0 refills | Status: AC
Start: 2024-05-22 — End: ?

## 2024-05-22 NOTE — Telephone Encounter (Signed)
 Hello, the pharmacy just called about the mupirocin  that was ordered for this pt on 5/28.  1. The insurance requires grams per application and location. 2. She said usually there is a better chance that the insurance will fill ointment over cream.   can we adjust that? thanks!

## 2024-06-17 ENCOUNTER — Other Ambulatory Visit (HOSPITAL_COMMUNITY): Payer: Self-pay | Admitting: Urology

## 2024-06-17 DIAGNOSIS — N281 Cyst of kidney, acquired: Secondary | ICD-10-CM

## 2024-07-15 ENCOUNTER — Other Ambulatory Visit (HOSPITAL_COMMUNITY): Payer: Self-pay | Admitting: Urology

## 2024-07-15 DIAGNOSIS — N281 Cyst of kidney, acquired: Secondary | ICD-10-CM

## 2024-07-18 ENCOUNTER — Ambulatory Visit (HOSPITAL_COMMUNITY)
Admission: RE | Admit: 2024-07-18 | Discharge: 2024-07-18 | Disposition: A | Discharge status: Home / Self Care | Source: Ambulatory Visit | Attending: Urology | Admitting: Urology

## 2024-07-18 DIAGNOSIS — N281 Cyst of kidney, acquired: Secondary | ICD-10-CM | POA: Diagnosis present

## 2024-07-18 MED ORDER — GADOBUTROL 1 MMOL/ML IV SOLN
10.0000 mL | Freq: Once | INTRAVENOUS | Status: AC | PRN
  Administered 2024-07-18: 10 mL via INTRAVENOUS
